# Patient Record
Sex: Male | Born: 1995 | Race: White | Hispanic: No | Marital: Single | Smoking: Never smoker
Health system: Southern US, Community
[De-identification: ages and names within clinical notes are randomized; demographics above are authoritative.]

## PROBLEM LIST (undated history)

## (undated) DIAGNOSIS — J353 Hypertrophy of tonsils with hypertrophy of adenoids: Secondary | ICD-10-CM

## (undated) DIAGNOSIS — Z8489 Family history of other specified conditions: Secondary | ICD-10-CM

## (undated) DIAGNOSIS — G43909 Migraine, unspecified, not intractable, without status migrainosus: Secondary | ICD-10-CM

## (undated) HISTORY — PX: TYMPANOSTOMY TUBE PLACEMENT: SHX32

---
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
## 2017-01-01 DIAGNOSIS — R109 Unspecified abdominal pain: Secondary | ICD-10-CM | POA: Diagnosis not present

## 2017-01-01 DIAGNOSIS — R450 Nervousness: Secondary | ICD-10-CM | POA: Diagnosis not present

## 2017-01-01 DIAGNOSIS — R140 Abdominal distension (gaseous): Secondary | ICD-10-CM | POA: Diagnosis not present

## 2017-01-01 DIAGNOSIS — Z68.41 Body mass index (BMI) pediatric, greater than or equal to 95th percentile for age: Secondary | ICD-10-CM | POA: Diagnosis not present

## 2017-01-07 ENCOUNTER — Emergency Department
Admission: EM | Admit: 2017-01-07 | Discharge: 2017-01-07 | Disposition: A | Discharge status: Home / Self Care | Payer: BLUE CROSS/BLUE SHIELD | Source: Home / Self Care | Attending: Family Medicine | Admitting: Family Medicine

## 2017-01-07 DIAGNOSIS — Z202 Contact with and (suspected) exposure to infections with a predominantly sexual mode of transmission: Secondary | ICD-10-CM

## 2017-01-07 NOTE — ED Triage Notes (Signed)
Pt has an irritated spot on groin.

## 2017-01-07 NOTE — ED Provider Notes (Signed)
[facility]    CSN: [id number] Arrival date & time: 01/07/17  1415     History   Chief Complaint Chief Complaint  Patient presents with  . SEXUALLY TRANSMITTED DISEASE    HPI Randy Munoz is a 21 y.o. male.   Patient noticed a small area of slight redness on the dorsum of his penis about one month ago.  There is no pain or discomfort, and no itching.  No urethral discharge, no testicular pain/swelling.  He requests STD testing:  HIV, RPR, HSV.   The history is provided by the patient.    Past Medical History:  Diagnosis Date  . Family history of adverse reaction to anesthesia    pt's mother has hx. of post-op N/V  . Migraines   . Tonsillar and adenoid hypertrophy 01/2016    Patient Active Problem List   Diagnosis Date Noted  . Post-tonsillectomy hemorrhage 03/13/2016  . Post-op bleeding 03/09/2016    Past Surgical History:  Procedure Laterality Date  . TONSILLECTOMY AND ADENOIDECTOMY Bilateral 03/03/2016   Procedure: TONSILLECTOMY AND ADENOIDECTOMY;  Surgeon: Newman Pies, MD;  Location: [facility];  Service: ENT;  Laterality: Bilateral;  . TONSILLECTOMY AND ADENOIDECTOMY N/A 03/13/2016   Procedure: TONSILLECTOMY AND ADENOIDECTOMY;  Surgeon: Newman Pies, MD;  Location: [facility];  Service: ENT;  Laterality: N/A;  . TYMPANOSTOMY TUBE PLACEMENT Bilateral    x 2       Home Medications    Prior to Admission medications   Medication Sig Start Date End Date Taking? Authorizing Provider  HYDROcodone-acetaminophen (HYCET) 7.5-325 mg/15 ml solution Take 10 mLs by mouth 4 (four) times daily as needed for moderate pain. 03/08/16   Glynn Octave, MD  ondansetron (ZOFRAN) 4 MG tablet Take 1 tablet (4 mg total) by mouth every 6 (six) hours. Patient taking differently: Take 4 mg by mouth every 8 (eight) hours as needed for nausea or vomiting.  03/08/16   Glynn Octave, MD  oxyCODONE (ROXICODONE) 5 MG/5ML solution Take 5-10 mLs (5-10 mg total) by mouth every  4 (four) hours as needed for severe pain. 03/11/16   Newman Pies, MD  phenazopyridine (PYRIDIUM) 200 MG tablet Take 1 tablet (200 mg total) by mouth 3 (three) times daily. 12/27/16   Charm Rings, MD  promethazine (PHENERGAN) 25 MG tablet Take 25 mg by mouth every 6 (six) hours as needed for nausea or vomiting.    Historical Provider, MD    Family History Family History  Problem Relation Age of Onset  . Cancer Mother     2014 breast; left  . Anesthesia problems Mother     post-op N/V  . Hypertension Father   . Cancer Maternal Grandfather     pelvis    Social History Social History  Substance Use Topics  . Smoking status: Never Smoker  . Smokeless tobacco: Never Used  . Alcohol use Yes     Comment: occasionally     Allergies   Naproxen   Review of Systems Review of Systems  Constitutional: Negative.   HENT: Negative.   Eyes: Negative.   Respiratory: Negative.   Cardiovascular: Negative.   Gastrointestinal: Negative.   Genitourinary: Negative.  Negative for discharge, genital sores, penile pain and penile swelling.  Musculoskeletal: Negative.   Skin: Positive for rash.  Neurological: Negative.      Physical Exam Triage Vital Signs ED Triage Vitals  Enc Vitals Group     BP 01/07/17 1433 136/76     Pulse  Rate 01/07/17 1433 67     Resp --      Temp 01/07/17 1433 98 F (36.7 C)     Temp Source 01/07/17 1433 Oral     SpO2 01/07/17 1433 99 %     Weight 01/07/17 1434 268 lb (121.6 kg)     Height 01/07/17 1434 6\' 3"  (1.905 m)     Head Circumference --      Peak Flow --      Pain Score 01/07/17 1435 0     Pain Loc --      Pain Edu? --      Excl. in GC? --    No data found.   Updated Vital Signs BP 136/76 (BP Location: Left Arm)   Pulse 67   Temp 98 F (36.7 C) (Oral)   Ht 6\' 3"  (1.905 m)   Wt 268 lb (121.6 kg)   SpO2 99%   BMI 33.50 kg/m   Visual Acuity Right Eye Distance:   Left Eye Distance:   Bilateral Distance:    Right Eye Near:   Left Eye  Near:    Bilateral Near:     Physical Exam  Constitutional: He appears well-developed and well-nourished. No distress.  HENT:  Head: Normocephalic.  Right Ear: External ear normal.  Left Ear: External ear normal.  Nose: Nose normal.  Mouth/Throat: Oropharynx is clear and moist.  Eyes: Conjunctivae are normal. Pupils are equal, round, and reactive to light.  Cardiovascular: Normal heart sounds.   Pulmonary/Chest: Breath sounds normal.  Abdominal: There is no tenderness.  Genitourinary:     Genitourinary Comments: Dorsal surface of glans has approximately 2mm diameter area that is minimally erythematous compared to surrounding skin, but appears normal otherwise; no surface changes visible under magnification.  Lymphadenopathy:    He has no cervical adenopathy.  Neurological: He is alert.  Skin: Skin is warm and dry.  Nursing note and vitals reviewed.    UC Treatments / Results  Labs (all labs ordered are listed, but only abnormal results are displayed) Labs Reviewed  RPR  HIV ANTIBODY (ROUTINE TESTING)  HSV(HERPES SMPLX)ABS-I+II(IGG+IGM)-BLD    EKG  EKG Interpretation None       Radiology No results found.  Procedures Procedures (including critical care time)  Medications Ordered in UC Medications - No data to display   Initial Impression / Assessment and Plan / UC Course  I have reviewed the triage vital signs and the nursing notes.  Pertinent labs & imaging results that were available during my care of the patient were reviewed by me and considered in my medical decision making (see chart for details).  Clinical Course   Normal appearing glans. May apply 1% hydrocortisone cream twice daily for 3 to 4 days as needed for irritation. Will check HIV, RPR, and HSV antibodies at patient's request.     Final Clinical Impressions(s) / UC Diagnoses   Final diagnoses:  Possible exposure to STD    New Prescriptions New Prescriptions   No medications on  file     Lattie HawStephen A Beese, MD 01/22/17 2106

## 2017-01-07 NOTE — Discharge Instructions (Signed)
May apply 1% hydrocortisone cream twice daily for 3 to 4 days as needed for irritation.

## 2017-01-08 LAB — RPR

## 2017-01-08 LAB — HIV ANTIBODY (ROUTINE TESTING W REFLEX): HIV: NONREACTIVE

## 2017-01-11 ENCOUNTER — Telehealth: Payer: Self-pay | Admitting: Emergency Medicine

## 2017-01-11 LAB — HSV TYPE I/II IGG, IGMW/ REFLEX
HSV 1 IgM Screen: NEGATIVE
HSV 2 IgM Screen: NEGATIVE

## 2017-02-17 DIAGNOSIS — F411 Generalized anxiety disorder: Secondary | ICD-10-CM | POA: Diagnosis not present

## 2017-02-17 DIAGNOSIS — F429 Obsessive-compulsive disorder, unspecified: Secondary | ICD-10-CM | POA: Diagnosis not present

## 2017-03-10 DIAGNOSIS — F429 Obsessive-compulsive disorder, unspecified: Secondary | ICD-10-CM | POA: Diagnosis not present

## 2017-03-10 DIAGNOSIS — F411 Generalized anxiety disorder: Secondary | ICD-10-CM | POA: Diagnosis not present

## 2017-04-13 DIAGNOSIS — F429 Obsessive-compulsive disorder, unspecified: Secondary | ICD-10-CM | POA: Diagnosis not present

## 2017-04-13 DIAGNOSIS — F411 Generalized anxiety disorder: Secondary | ICD-10-CM | POA: Diagnosis not present

## 2017-10-05 DIAGNOSIS — J029 Acute pharyngitis, unspecified: Secondary | ICD-10-CM | POA: Diagnosis not present

## 2017-10-27 DIAGNOSIS — J029 Acute pharyngitis, unspecified: Secondary | ICD-10-CM | POA: Diagnosis not present

## 2017-11-11 DIAGNOSIS — Z9089 Acquired absence of other organs: Secondary | ICD-10-CM | POA: Diagnosis not present

## 2017-11-11 DIAGNOSIS — J358 Other chronic diseases of tonsils and adenoids: Secondary | ICD-10-CM | POA: Diagnosis not present

## 2019-07-31 DIAGNOSIS — H6691 Otitis media, unspecified, right ear: Secondary | ICD-10-CM | POA: Diagnosis not present

## 2019-07-31 DIAGNOSIS — H6011 Cellulitis of right external ear: Secondary | ICD-10-CM | POA: Diagnosis not present

## 2019-08-04 DIAGNOSIS — Z6836 Body mass index (BMI) 36.0-36.9, adult: Secondary | ICD-10-CM | POA: Diagnosis not present

## 2019-08-04 DIAGNOSIS — H6090 Unspecified otitis externa, unspecified ear: Secondary | ICD-10-CM | POA: Diagnosis not present

## 2019-08-07 DIAGNOSIS — H61301 Acquired stenosis of right external ear canal, unspecified: Secondary | ICD-10-CM | POA: Diagnosis not present

## 2019-08-07 DIAGNOSIS — H60311 Diffuse otitis externa, right ear: Secondary | ICD-10-CM | POA: Diagnosis not present

## 2019-08-11 DIAGNOSIS — H60311 Diffuse otitis externa, right ear: Secondary | ICD-10-CM | POA: Diagnosis not present
